# Patient Record
Sex: Female | Born: 1980 | Race: White | Hispanic: No | Marital: Married | Smoking: Never smoker
Health system: Southern US, Community
[De-identification: ages and names within clinical notes are randomized; demographics above are authoritative.]

---

## 2004-02-28 ENCOUNTER — Ambulatory Visit (HOSPITAL_COMMUNITY): Admission: RE | Admit: 2004-02-28 | Discharge: 2004-02-28 | Payer: Self-pay | Admitting: Certified Nurse Midwife

## 2004-05-07 ENCOUNTER — Observation Stay (HOSPITAL_COMMUNITY): Admission: AD | Admit: 2004-05-07 | Discharge: 2004-05-08 | Payer: Self-pay | Admitting: Obstetrics

## 2004-07-04 ENCOUNTER — Inpatient Hospital Stay (HOSPITAL_COMMUNITY): Admission: AD | Admit: 2004-07-04 | Discharge: 2004-07-06 | Payer: Self-pay | Admitting: Obstetrics

## 2005-08-15 IMAGING — US US OB COMP +14 WK
1 series · 13 of 28 positions shown · non-contrast
Comparison: none

CLINICAL DATA: Anatomy scan; no information regarding dating by LMP; uncertain dates.

[Series 1: us ob comp +14 wk · 0.31mm/px · 13 of 76 slices shown]
[im 3/76]
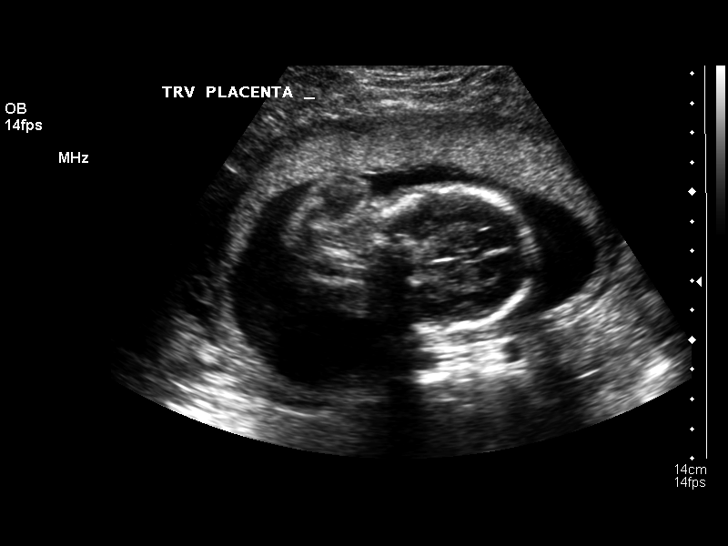
[im 9/76]
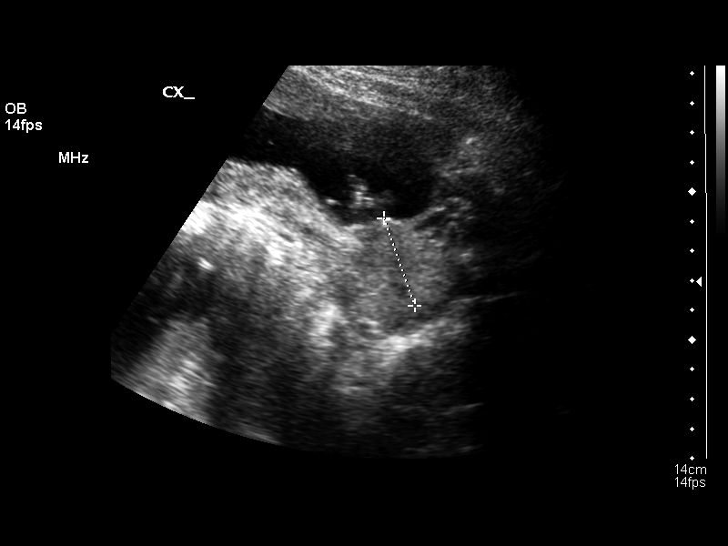
[im 14/76]
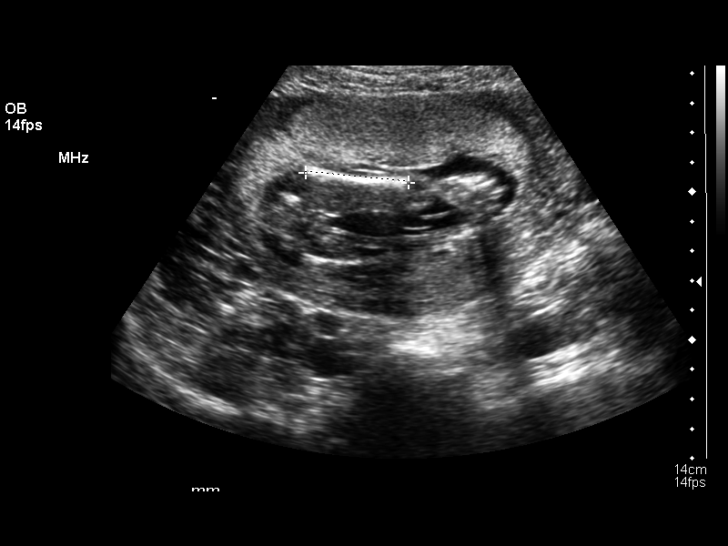
[im 20/76]
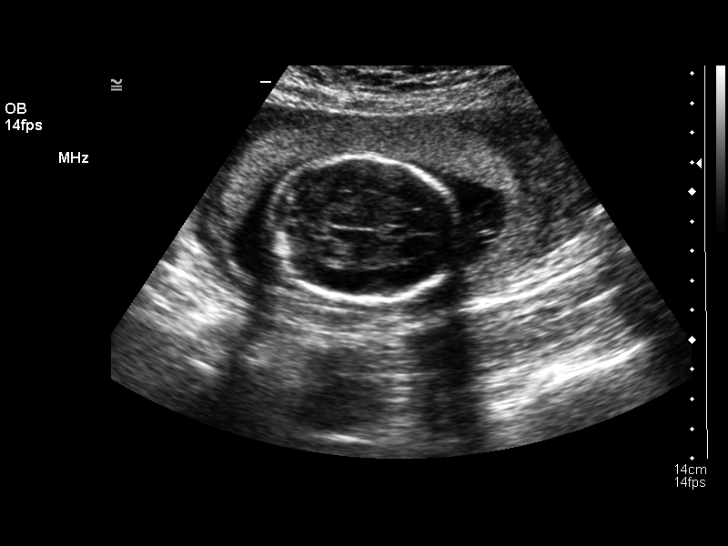
[im 26/76]
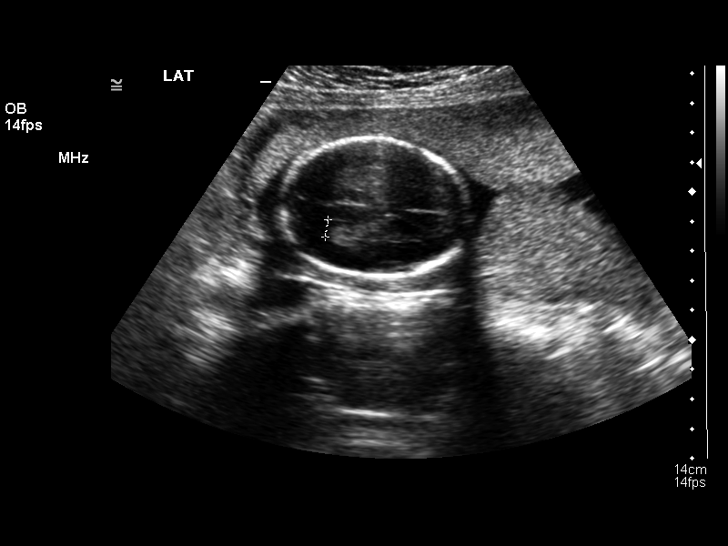
[im 31/76]
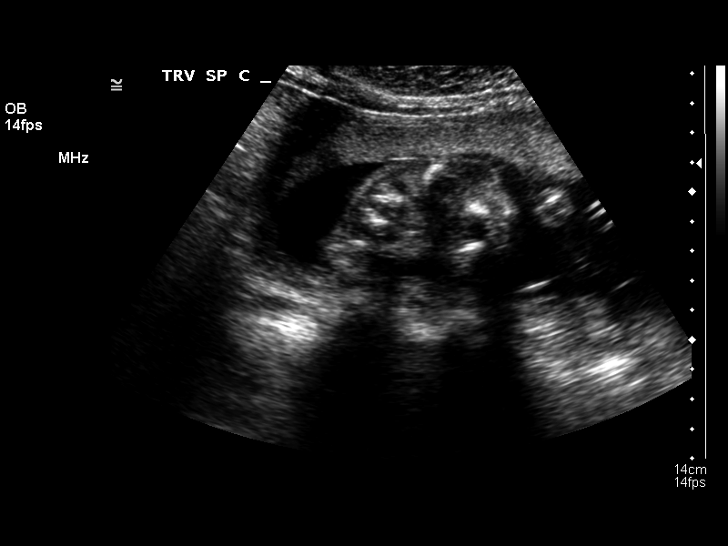
[im 39/76]
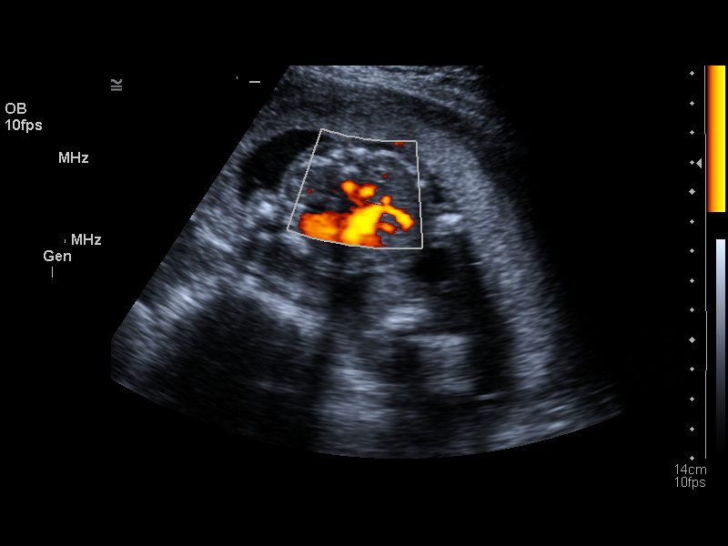
[im 45/76]
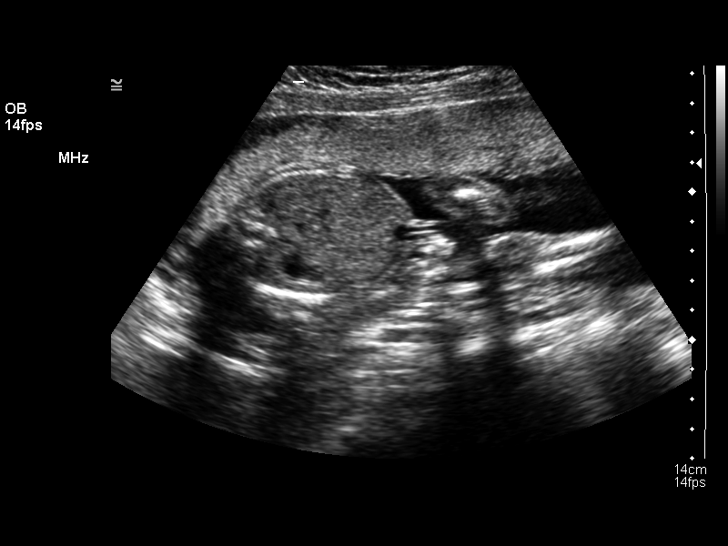
[im 51/76]
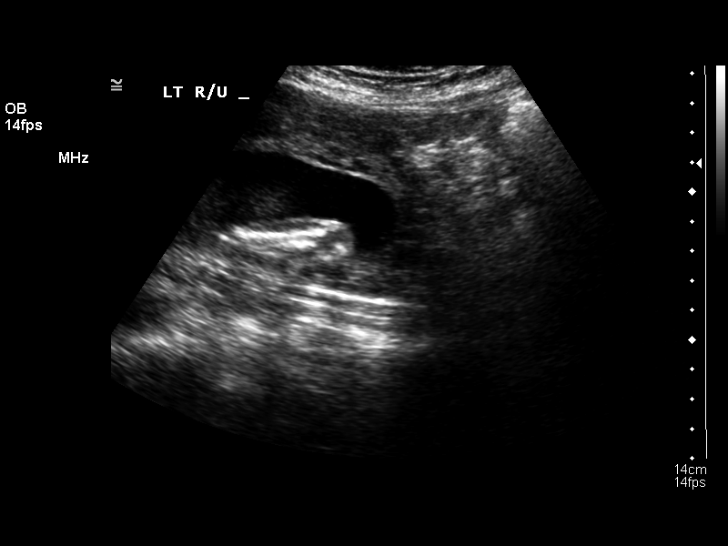
[im 56/76]
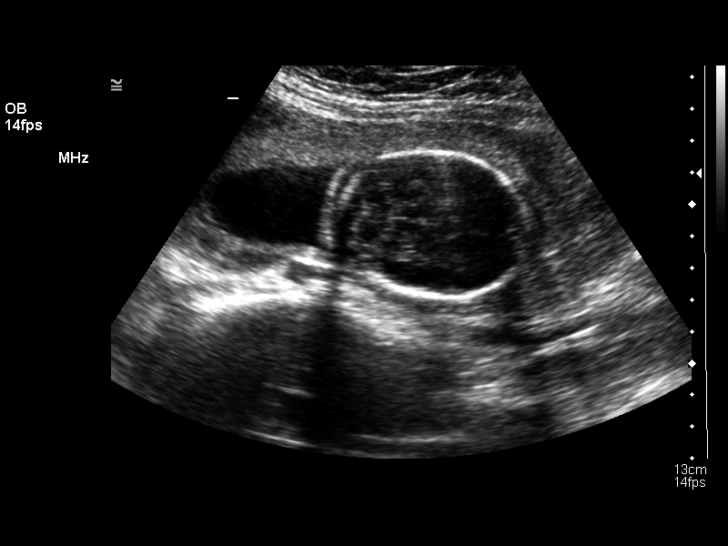
[im 62/76]
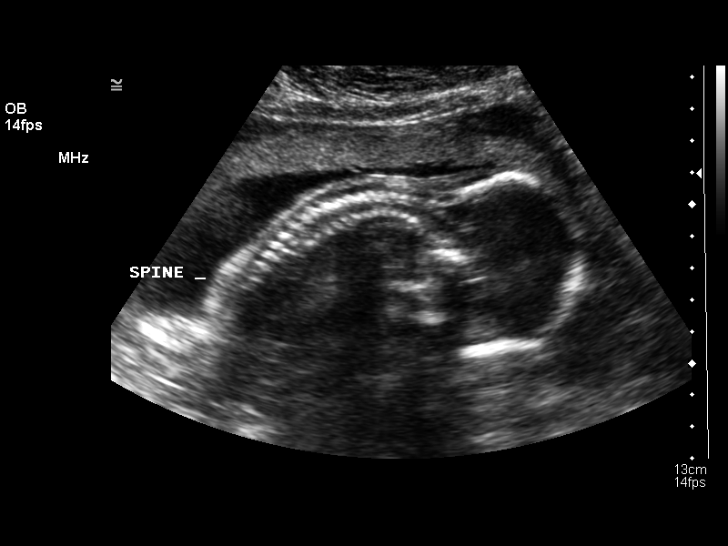
[im 67/76]
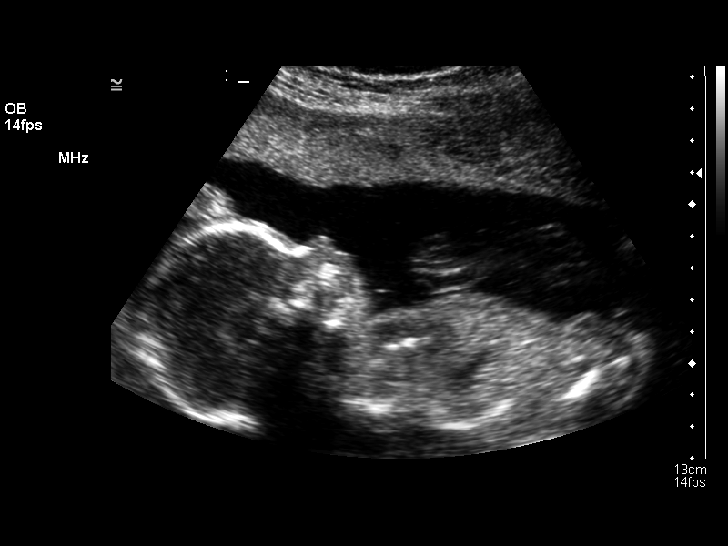
[im 73/76]
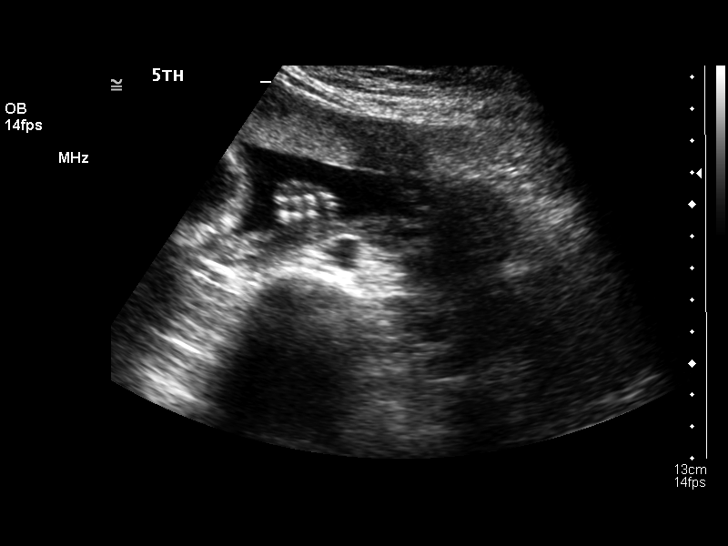

[13 of 28 positions shown; findings below may reference images not displayed]

OBSTETRICAL ULTRASOUND
 Number of Fetuses: 1
 Heart Rate:  157
 Movement:  Yes
 Breathing:    No  
 Presentation:  Breech
 Placental Location:  Anterior
 Grade:  I
 Previa:  No
 Amniotic Fluid (Subjective):  Normal
 Amniotic Fluid (Objective):   3.2 cm Vertical pocket 

 FETAL BIOMETRY
 BPD:   4.7 cm   20 w 2 d
 HC:   18.4 cm   20 w 5 d
 AC:   16.6 cm   21 w 5 d
 FL:    3.5 cm   20 w 6 d

 MEAN GA:  20 w 6 d

 FETAL ANATOMY
 Lateral Ventricles:    Visualized 
 Thalami/CSP:      Visualized 
 Posterior Fossa:  Visualized 
 Nuchal Region:    Visualized 
 Spine:      Visualized 
 4 Chamber Heart on Left:      Visualized 
 Stomach on Left:      Visualized 
 3 Vessel Cord:    Visualized 
 Cord Insertion site:    Visualized 
 Kidneys:  Visualized 
 Bladder:  Visualized 
 Extremities:      Visualized 

 ADDITIONAL ANATOMY VISUALIZED:  LVOT, RVOT, upper lip, orbits, profile, diaphragm, heel, 5th digit, ductal arch, aortic arch, female genitalia and nasal bone.

 MATERNAL FINDINGS
 Cervix:   3.1 cm Transabdominally
IMPRESSION: There is a single living intrauterine gestation in breech presentation.  The mean gestational age by today?s ultrasound is 20 weeks 6 days with an EDC of 07/11/04.  The visualized anatomy is normal and the fetal indices are concordant.  

 </u12:p>

## 2005-10-24 IMAGING — US US OB COMP +14 WK
1 series · 13 of 26 positions shown · non-contrast
Comparison: none

CLINICAL DATA: Vaginal bleeding; assigned gestational age is 30 weeks 6 days.

[Series 1: us ob comp +14 wk · 13 of 26 slices shown]
[im 2/26]
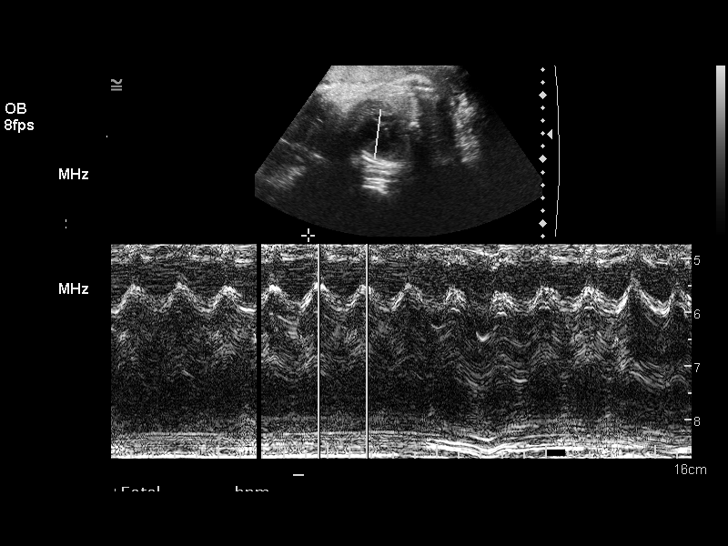
[im 4/26]
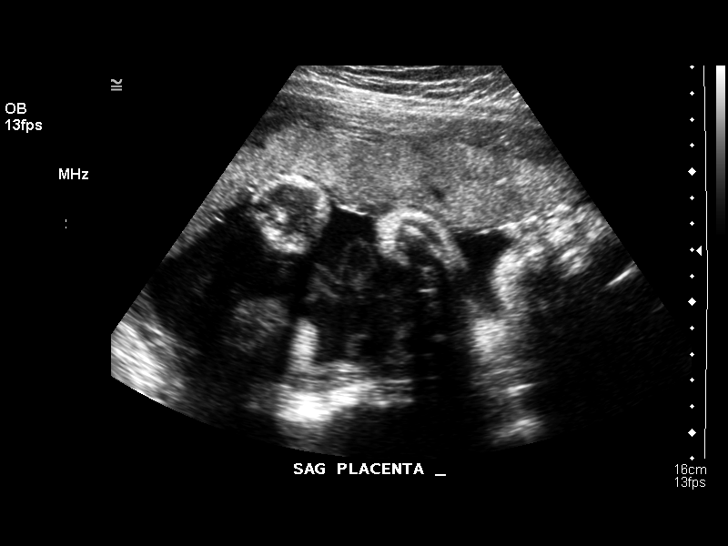
[im 6/26]
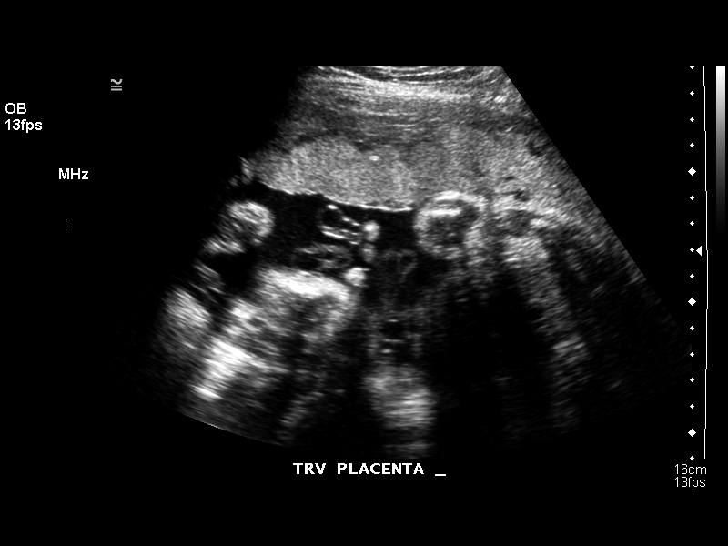
[im 8/26]
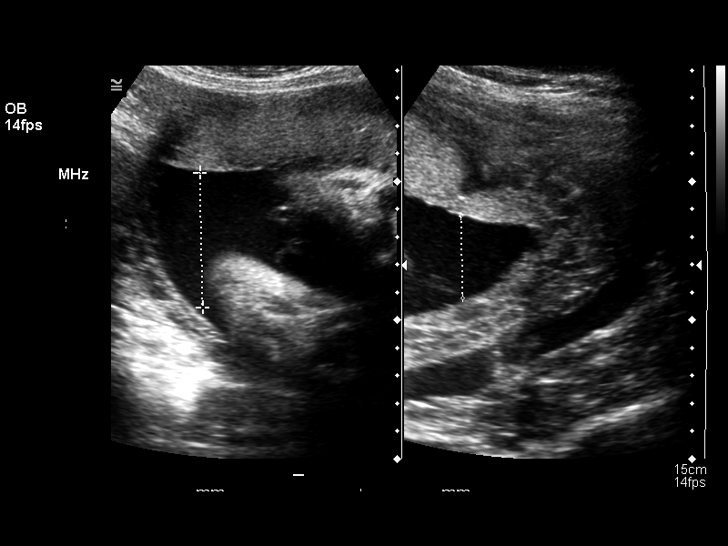
[im 10/26]
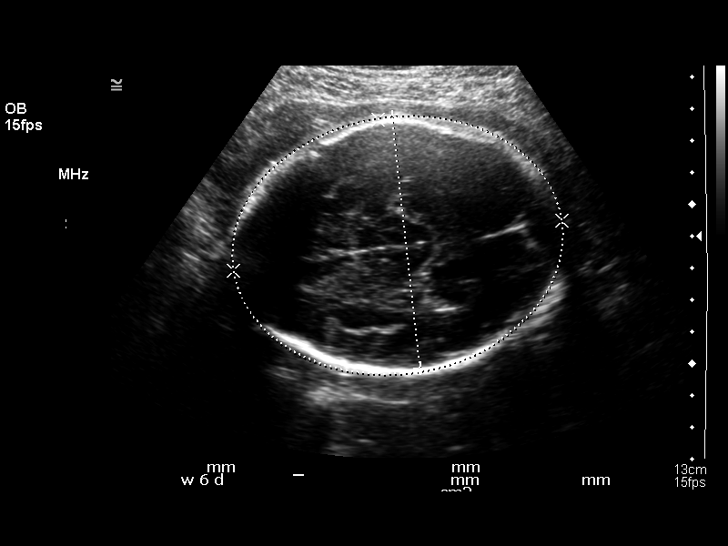
[im 12/26]
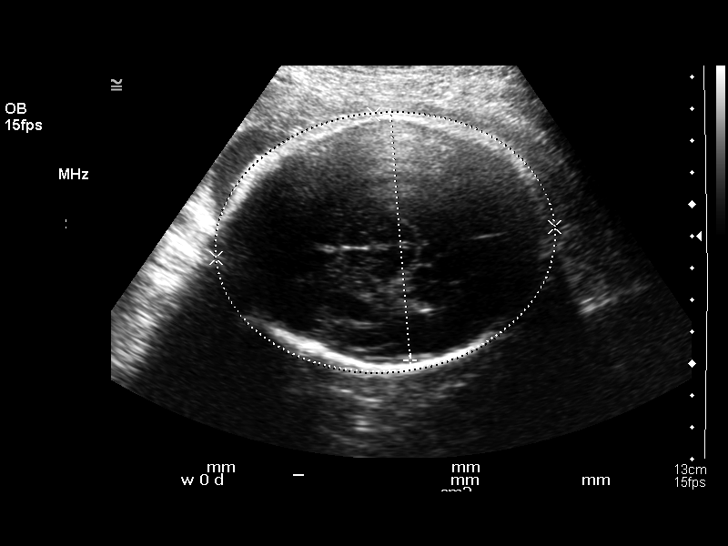
[im 14/26]
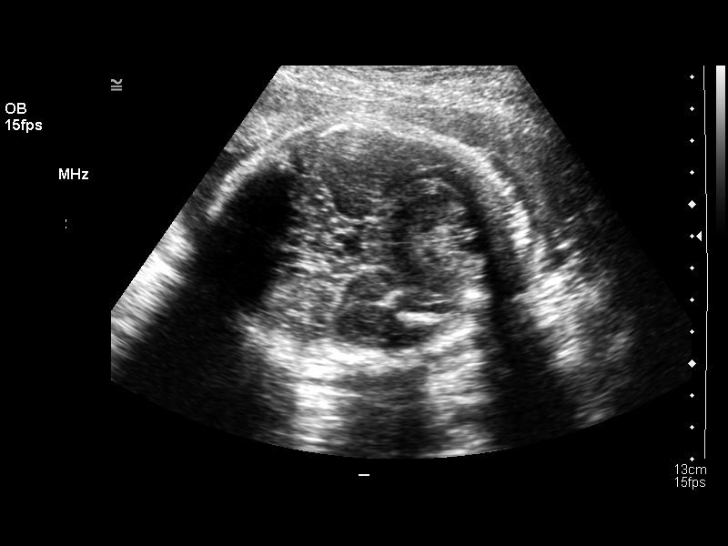
[im 16/26]
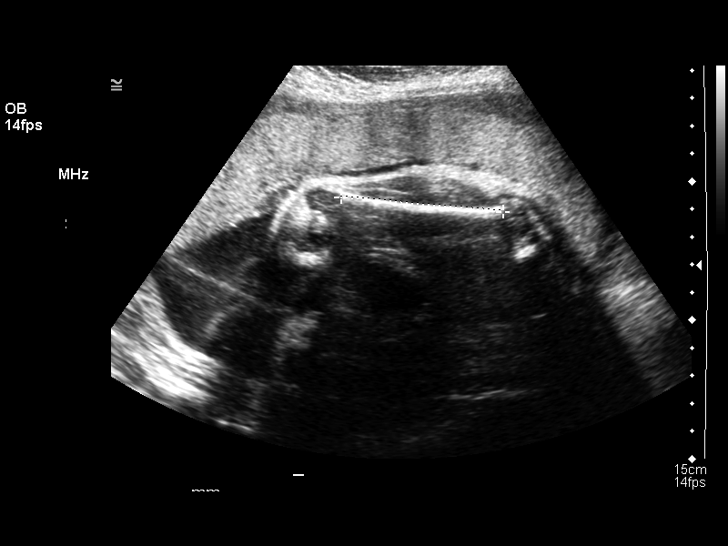
[im 18/26]
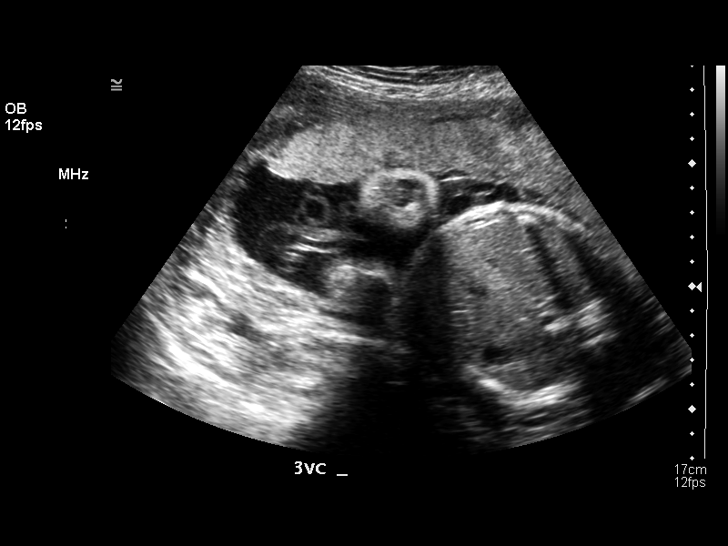
[im 20/26]
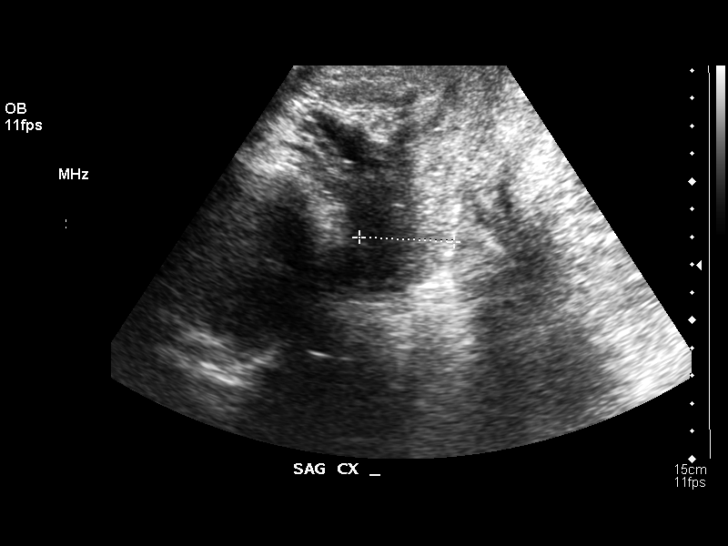
[im 22/26]
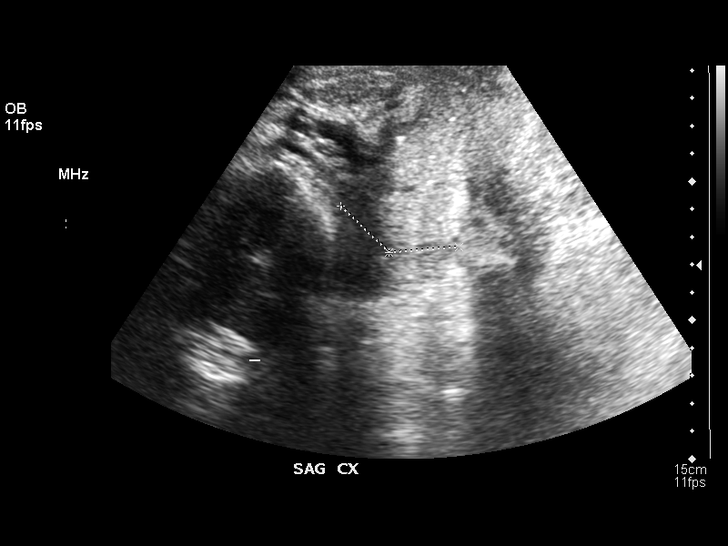
[im 24/26]
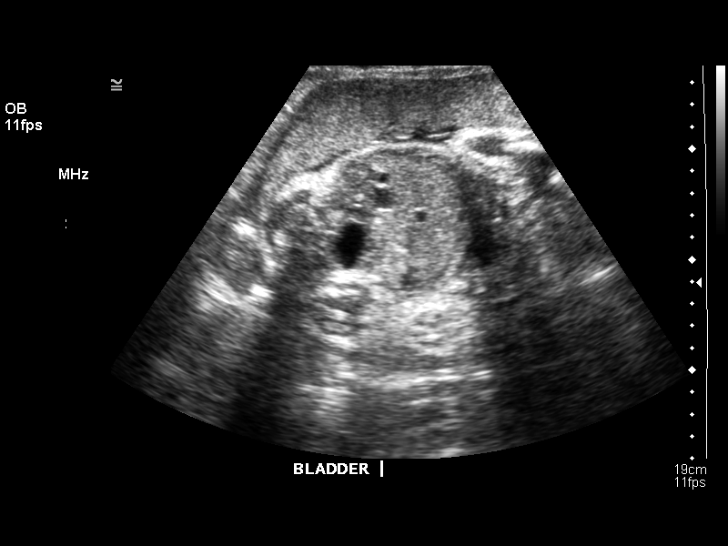
[im 26/26]
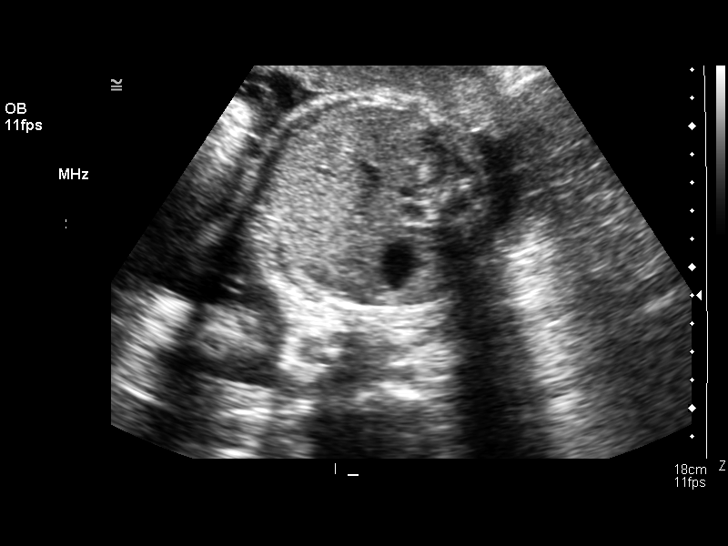

[13 of 26 positions shown; findings below may reference images not displayed]

OBSTETRICAL ULTRASOUND
 Number of Fetuses:  1
 Heart Rate:  136
 Movement:  Yes
 Breathing:  Yes  
 Presentation:  Cephalic
 Placental Location:  Anterior
 Grade:  I
 Previa:  No
 Amniotic Fluid (Subjective):  Normal
 Amniotic Fluid (Objective):   14.7 cm AFI (5th -95th%ile = 8.8 ? 23.8 cm for 31 wks)

 FETAL BIOMETRY
 BPD:   7.9 cm   31 w 4 d
 HC:   29.3 cm   32 w 2 d
 AC:   27.2 cm   31 w 2 d
 FL:    5.8 cm   30 w 2 d

 MEAN GA:  31 w 2 d

 EFW:  3673 g (H) 75th ? 90th%ile (8084 ? 0812 g) For 31 wks

 FETAL ANATOMY
 Lateral Ventricles:    Visualized 
 Thalami/CSP:      Visualized 
 Posterior Fossa:  Visualized 
 Nuchal Region:    Previously seen 
 Spine:      Previously seen 
 4 Chamber Heart on Left:      Previously seen 
 Stomach on Left:      Visualized 
 3 Vessel Cord:    Visualized 
 Cord Insertion site:    Previously seen 
 Kidneys:  Visualized 
 Bladder:  Visualized 
 Extremities:      Previously seen 

 MATERNAL FINDINGS
 Cervix:   3.4 cm Translabially
IMPRESSION: There is a single living intrauterine gestation in cephalic presentation.  The estimated fetal weight is at the 75th ? 90th percentile for a 31 week gestation.  The fetal indices are concordant.  
 The amniotic fluid volume is within normal limits.  
 The cervix is long and closed, measuring 3.4 cm translabially.

 </u12:p>

## 2009-03-30 ENCOUNTER — Ambulatory Visit (HOSPITAL_COMMUNITY): Admission: RE | Admit: 2009-03-30 | Discharge: 2009-03-30 | Payer: Self-pay | Admitting: Obstetrics & Gynecology

## 2009-07-31 ENCOUNTER — Inpatient Hospital Stay (HOSPITAL_COMMUNITY)
Admission: AD | Admit: 2009-07-31 | Discharge: 2009-08-01 | Payer: Self-pay | Source: Ambulatory Visit | Admitting: Obstetrics

## 2010-09-15 IMAGING — US US OB DETAIL+14 WK
1 series · 14 of 28 positions shown · non-contrast
Comparison: none

OBSTETRICAL ULTRASOUND:
 This ultrasound exam was performed in the [HOSPITAL] Ultrasound Department.  The OB US report was generated in the AS system, and faxed to the ordering physician.  This report is also available in [REDACTED] PACS.

[Series 1: us ob detail +14 wk · 46 acquisitions, 14 frames shown]
[im 2/46]
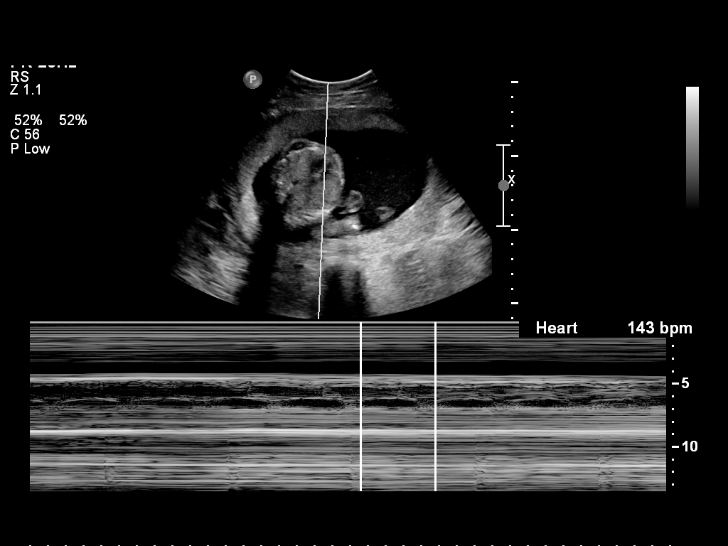
[im 6/46]
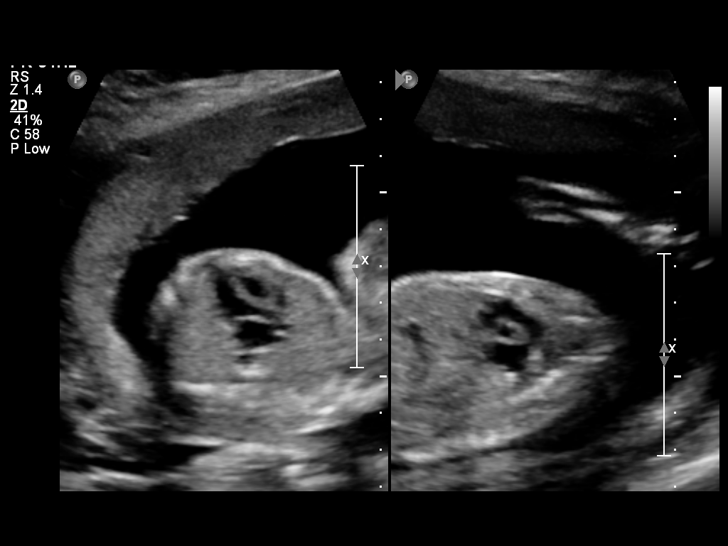
[im 9/46]
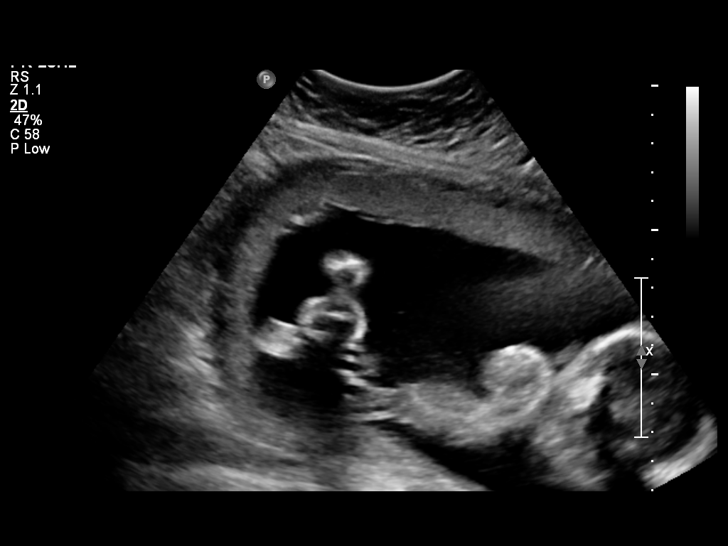
[im 12/46]
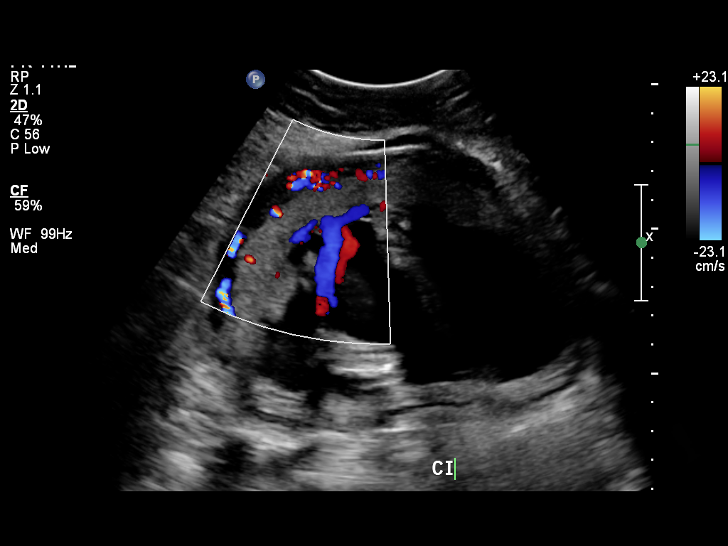
[im 16/46]
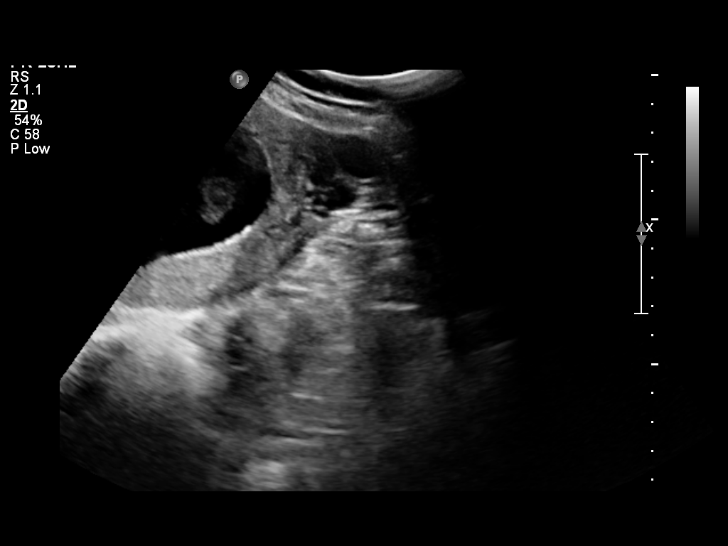
[im 19/46]
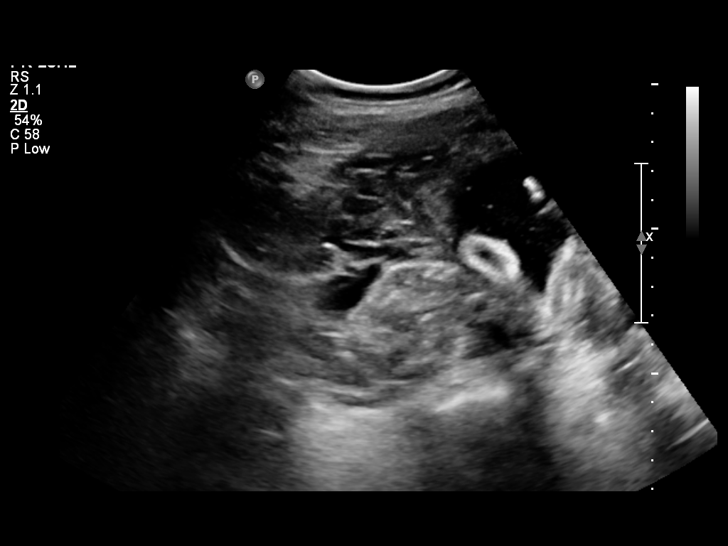
[im 22/46]
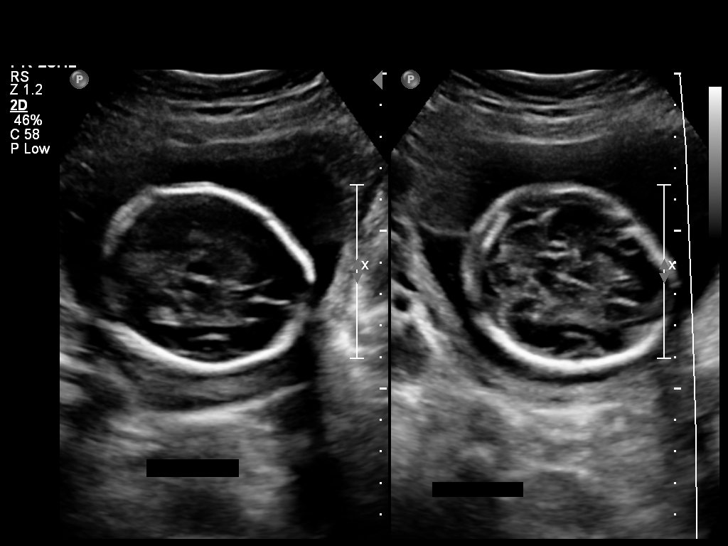
[im 26/46]
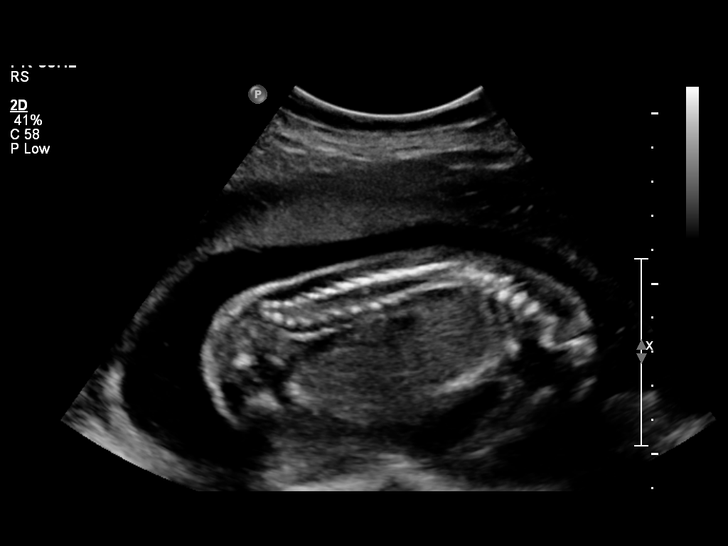
[im 29/46]
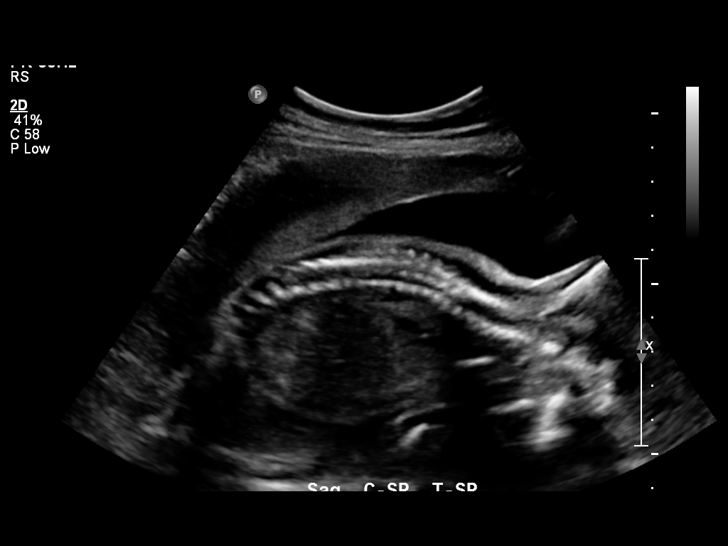
[im 32/46]
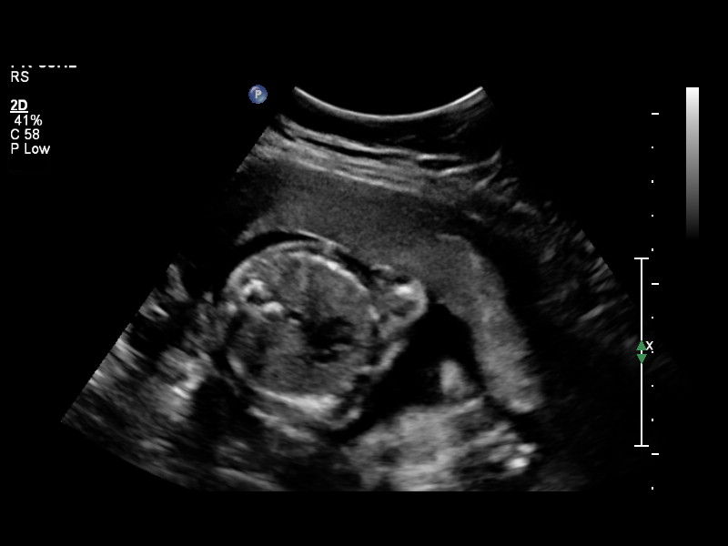
[im 36/46]
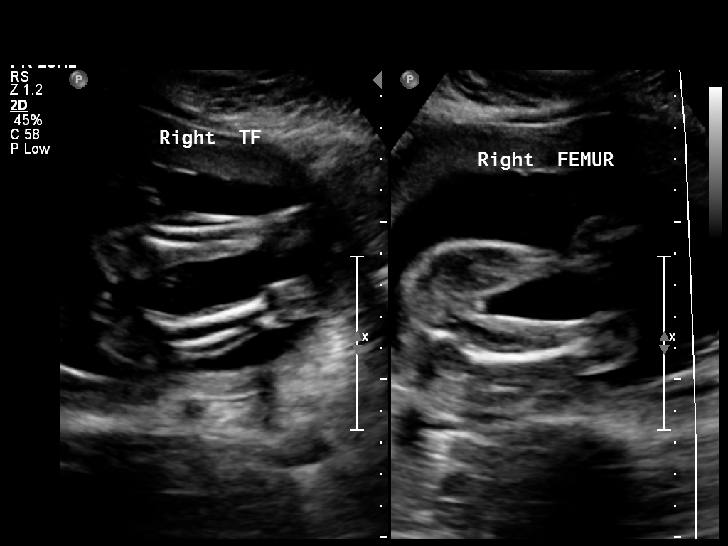
[im 39/46]
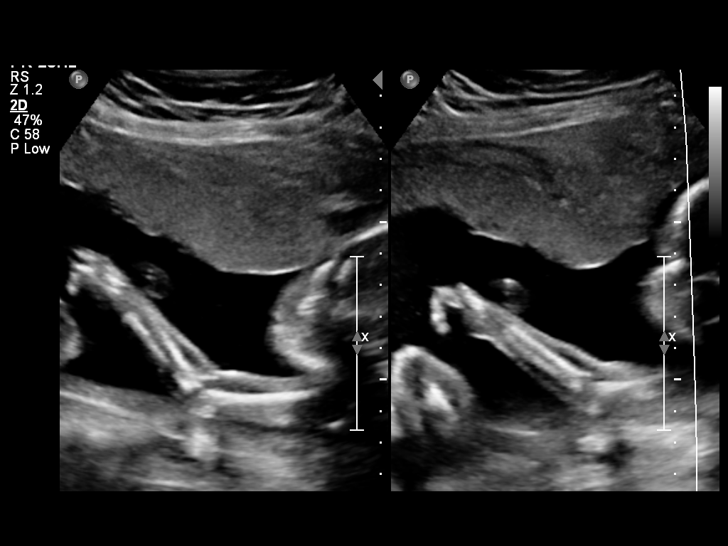
[im 42/46]
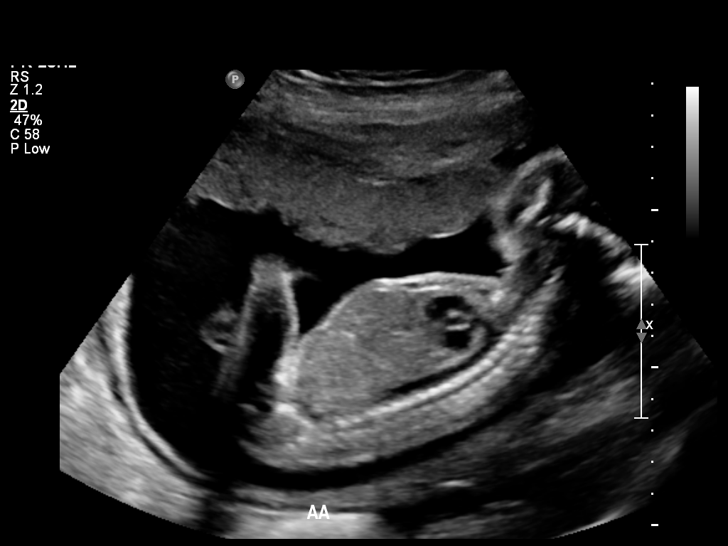
[im 46/46]
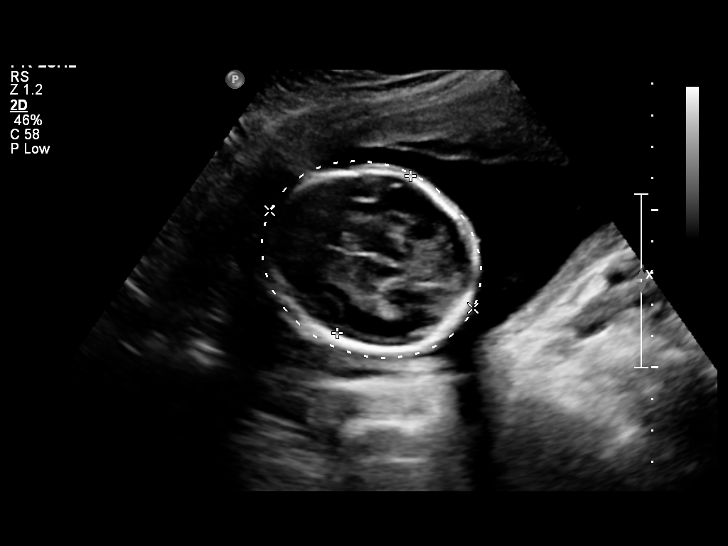

[14 of 28 positions shown; findings below may reference images not displayed]

IMPRESSION: See [facility] Obstetric US report.

## 2010-10-07 LAB — CBC
HCT: 28.3 % — ABNORMAL LOW (ref 36.0–46.0)
HCT: 32.7 % — ABNORMAL LOW (ref 36.0–46.0)
Hemoglobin: 10.7 g/dL — ABNORMAL LOW (ref 12.0–15.0)
Hemoglobin: 9.3 g/dL — ABNORMAL LOW (ref 12.0–15.0)
MCHC: 32.7 g/dL (ref 30.0–36.0)
MCHC: 32.8 g/dL (ref 30.0–36.0)
MCV: 83.4 fL (ref 78.0–100.0)
MCV: 84.4 fL (ref 78.0–100.0)
Platelets: 105 10*3/uL — ABNORMAL LOW (ref 150–400)
Platelets: 120 10*3/uL — ABNORMAL LOW (ref 150–400)
RBC: 3.35 MIL/uL — ABNORMAL LOW (ref 3.87–5.11)
RBC: 3.92 MIL/uL (ref 3.87–5.11)
RDW: 16.7 % — ABNORMAL HIGH (ref 11.5–15.5)
RDW: 17.1 % — ABNORMAL HIGH (ref 11.5–15.5)
WBC: 12 10*3/uL — ABNORMAL HIGH (ref 4.0–10.5)
WBC: 9.9 10*3/uL (ref 4.0–10.5)

## 2010-10-07 LAB — RPR: RPR Ser Ql: NONREACTIVE

## 2010-11-20 ENCOUNTER — Inpatient Hospital Stay (HOSPITAL_COMMUNITY)
Admission: AD | Admit: 2010-11-20 | Discharge: 2010-11-22 | DRG: 372 | Disposition: A | Discharge status: Home / Self Care | Payer: BC Managed Care – PPO | Source: Ambulatory Visit | Attending: Obstetrics | Admitting: Obstetrics

## 2010-11-20 LAB — CBC
HCT: 38.1 % (ref 36.0–46.0)
Hemoglobin: 12.4 g/dL (ref 12.0–15.0)
MCH: 29 pg (ref 26.0–34.0)
MCHC: 32.5 g/dL (ref 30.0–36.0)
MCV: 89.2 fL (ref 78.0–100.0)
Platelets: 138 10*3/uL — ABNORMAL LOW (ref 150–400)
RBC: 4.27 MIL/uL (ref 3.87–5.11)
RDW: 15.5 % (ref 11.5–15.5)
WBC: 10.7 10*3/uL — ABNORMAL HIGH (ref 4.0–10.5)

## 2010-11-20 LAB — RPR: RPR Ser Ql: NONREACTIVE

## 2010-11-22 LAB — CBC
HCT: 33.7 % — ABNORMAL LOW (ref 36.0–46.0)
Hemoglobin: 10.9 g/dL — ABNORMAL LOW (ref 12.0–15.0)
MCH: 29.4 pg (ref 26.0–34.0)
MCHC: 32.3 g/dL (ref 30.0–36.0)
MCV: 90.8 fL (ref 78.0–100.0)
Platelets: 132 10*3/uL — ABNORMAL LOW (ref 150–400)
RBC: 3.71 MIL/uL — ABNORMAL LOW (ref 3.87–5.11)
RDW: 15.9 % — ABNORMAL HIGH (ref 11.5–15.5)
WBC: 9 10*3/uL (ref 4.0–10.5)

## 2015-08-11 ENCOUNTER — Encounter: Payer: Self-pay | Admitting: Obstetrics

## 2015-08-11 ENCOUNTER — Ambulatory Visit (INDEPENDENT_AMBULATORY_CARE_PROVIDER_SITE_OTHER): Payer: BLUE CROSS/BLUE SHIELD | Admitting: Obstetrics

## 2015-08-11 VITALS — BP 109/67 | HR 86 | Temp 98.3°F | Wt 158.0 lb

## 2015-08-11 DIAGNOSIS — N898 Other specified noninflammatory disorders of vagina: Secondary | ICD-10-CM

## 2015-08-11 DIAGNOSIS — T192XXA Foreign body in vulva and vagina, initial encounter: Secondary | ICD-10-CM

## 2015-08-11 NOTE — Progress Notes (Signed)
Patient ID: Chelsea Banks, female   DOB: [birth year], 35 y.o.   MRN: [id number]  Chief Complaint  Patient presents with  . Vaginal Discharge    vaginal discharge with odor    HPI Chelsea Banks is a 35 y.o. female.  Vaginal discharge with odor.  Bad vaginal odor after intercourse.  HPI  History reviewed. No pertinent past medical history.  History reviewed. No pertinent past surgical history.  History reviewed. No pertinent family history.  Social History Social History  Substance Use Topics  . Smoking status: Never Smoker   . Smokeless tobacco: Never Used  . Alcohol Use: 0.0 oz/week    0 Standard drinks or equivalent per week     Comment: Occassionally    No Known Allergies  No current outpatient prescriptions on file.   No current facility-administered medications for this visit.    Review of Systems Review of Systems Constitutional: negative for fatigue and weight loss Respiratory: negative for cough and wheezing Cardiovascular: negative for chest pain, fatigue and palpitations Gastrointestinal: negative for abdominal pain and change in bowel habits Genitourinary: vaginal discharge with odor Integument/breast: negative for nipple discharge Musculoskeletal:negative for myalgias Neurological: negative for gait problems and tremors Behavioral/Psych: negative for abusive relationship, depression Endocrine: negative for temperature intolerance     Blood pressure 109/67, pulse 86, temperature 98.3 F (36.8 C), weight 158 lb (71.668 kg), last menstrual period 07/11/2015.  Physical Exam Physical Exam           General:  Alert and no distress Abdomen:  normal findings: no organomegaly, soft, non-tender and no hernia  Pelvis:  External genitalia: normal general appearance Urinary system: urethral meatus normal and bladder without fullness, nontender Vaginal: malodorous tampon observed in posterior vault, removed.  No lesions or masses observed. Cervix: normal  appearance Adnexa: normal bimanual exam Uterus: anteverted and non-tender, normal size      Data Reviewed Urinalysis Wet prep  Assessment     Malodorous vaginal discharge Tampon left in vagina     Plan    Wet prep sent Tampon removed F/U prn  Orders Placed This Encounter  Procedures  . SureSwab, Vaginosis/Vaginitis Plus   No orders of the defined types were placed in this encounter.

## 2015-08-15 LAB — SURESWAB, VAGINOSIS/VAGINITIS PLUS
Atopobium vaginae: NOT DETECTED Log (cells/mL)
BV CATEGORY: UNDETERMINED — AB
C. albicans, DNA: NOT DETECTED
C. glabrata, DNA: NOT DETECTED
C. parapsilosis, DNA: NOT DETECTED
C. trachomatis RNA, TMA: NOT DETECTED
C. tropicalis, DNA: NOT DETECTED
Gardnerella vaginalis: 6.2 Log (cells/mL)
LACTOBACILLUS SPECIES: 6.4 Log (cells/mL)
MEGASPHAERA SPECIES: NOT DETECTED Log (cells/mL)
N. gonorrhoeae RNA, TMA: NOT DETECTED
T. vaginalis RNA, QL TMA: NOT DETECTED

## 2015-08-16 ENCOUNTER — Other Ambulatory Visit: Payer: Self-pay | Admitting: Obstetrics

## 2015-08-16 DIAGNOSIS — B9689 Other specified bacterial agents as the cause of diseases classified elsewhere: Secondary | ICD-10-CM

## 2015-08-16 DIAGNOSIS — N760 Acute vaginitis: Principal | ICD-10-CM

## 2015-08-16 MED ORDER — METRONIDAZOLE 500 MG PO TABS: 500.0000 mg | ORAL_TABLET | Freq: Two times a day (BID) | ORAL | Status: DC

## 2015-12-21 ENCOUNTER — Ambulatory Visit (INDEPENDENT_AMBULATORY_CARE_PROVIDER_SITE_OTHER): Payer: BLUE CROSS/BLUE SHIELD | Admitting: Obstetrics

## 2015-12-21 ENCOUNTER — Encounter: Payer: Self-pay | Admitting: Obstetrics

## 2015-12-21 VITALS — BP 120/73 | HR 84 | Temp 99.3°F | Wt 150.0 lb

## 2015-12-21 DIAGNOSIS — Z01419 Encounter for gynecological examination (general) (routine) without abnormal findings: Secondary | ICD-10-CM

## 2015-12-23 NOTE — Progress Notes (Signed)
   Subjective:        Chelsea Banks is a 35 y.o. female here for a routine exam.  Current complaints: None.    Personal health questionnaire:  Is patient [name], have a family history of breast and/or ovarian cancer: no Is there a family history of uterine cancer diagnosed at age < 7050, gastrointestinal cancer, urinary tract cancer, family member who is a Personnel officerLynch syndrome-associated carrier: no Is the patient overweight and hypertensive, family history of diabetes, personal history of gestational diabetes, preeclampsia or PCOS: no Is patient over 3255, have PCOS,  family history of premature CHD under age 37, diabetes, smoke, have hypertension or peripheral artery disease:  no At any time, has a partner hit, kicked or otherwise hurt or frightened you?: no Over the past 2 weeks, have you felt down, depressed or hopeless?: no Over the past 2 weeks, have you felt little interest or pleasure in doing things?:no   Gynecologic History No LMP recorded. Contraception: vasectomy Last Pap: not asked. Results were: not asked Last mammogram: n/a. Results were: n/a  Obstetric History OB History  No data available    History reviewed. No pertinent past medical history.  History reviewed. No pertinent past surgical history.  No current outpatient prescriptions on file. No Known Allergies  Social History  Substance Use Topics  . Smoking status: Never Smoker   . Smokeless tobacco: Never Used  . Alcohol Use: 0.0 oz/week    0 Standard drinks or equivalent per week     Comment: Occassionally    History reviewed. No pertinent family history.    Review of Systems  Constitutional: negative for fatigue and weight loss Respiratory: negative for cough and wheezing Cardiovascular: negative for chest pain, fatigue and palpitations Gastrointestinal: negative for abdominal pain and change in bowel habits Musculoskeletal:negative for myalgias Neurological: negative for gait problems and  tremors Behavioral/Psych: negative for abusive relationship, depression Endocrine: negative for temperature intolerance   Genitourinary:negative for abnormal menstrual periods, genital lesions, hot flashes, sexual problems and vaginal discharge Integument/breast: negative for breast lump, breast tenderness, nipple discharge and skin lesion(s)    Objective:       BP 120/73 mmHg  Pulse 84  Temp(Src) 99.3 F (37.4 C)  Wt 150 lb (68.04 kg) General:   alert  Skin:   no rash or abnormalities  Lungs:   clear to auscultation bilaterally  Heart:   regular rate and rhythm, S1, S2 normal, no murmur, click, rub or gallop  Breasts:   normal without suspicious masses, skin or nipple changes or axillary nodes  Abdomen:  normal findings: no organomegaly, soft, non-tender and no hernia  Pelvis:  External genitalia: normal general appearance Urinary system: urethral meatus normal and bladder without fullness, nontender Vaginal: normal without tenderness, induration or masses Cervix: normal appearance Adnexa: normal bimanual exam Uterus: anteverted and non-tender, normal size   Lab Review Urine pregnancy test Labs reviewed yes Radiologic studies reviewed no    Assessment:    Healthy female exam.    Plan:    Education reviewed: calcium supplements, low fat, low cholesterol diet, self breast exams and weight bearing exercise. Contraception: vasectomy. Follow up in: 1 year.   No orders of the defined types were placed in this encounter.   No orders of the defined types were placed in this encounter.

## 2015-12-26 LAB — NUSWAB VG+, CANDIDA 6SP
Candida albicans, NAA: NEGATIVE
Candida glabrata, NAA: NEGATIVE
Candida krusei, NAA: NEGATIVE
Candida lusitaniae, NAA: NEGATIVE
Candida parapsilosis, NAA: NEGATIVE
Candida tropicalis, NAA: NEGATIVE
Chlamydia trachomatis, NAA: NEGATIVE
Neisseria gonorrhoeae, NAA: NEGATIVE
Trich vag by NAA: NEGATIVE

## 2015-12-26 LAB — PAP IG AND HPV HIGH-RISK
HPV, high-risk: NEGATIVE
PAP Smear Comment: 0

## 2024-07-01 ENCOUNTER — Ambulatory Visit: Payer: Self-pay | Admitting: *Deleted

## 2024-07-01 NOTE — Telephone Encounter (Signed)
 FYI Only or Action Required?: FYI only for provider: recommended UC .  Patient was last seen in primary care on no PCP.  Called Nurse Triage reporting Sore Throat. Ear pain, cough, runny nose   Symptoms began a week ago.  Interventions attempted: Rest, hydration, or home remedies.  Symptoms are: gradually worsening.  Triage Disposition: See Physician Within 24 Hours  Patient/caregiver understands and will follow disposition?: Yes    Requesting new patient appt. Earliest appt patient agreeable with 09/08/24 at [facility].         Copied from CRM #[id number]. Topic: Clinical - Red Word Triage >> Jul 01, 2024 10:37 AM Rea ORN wrote: Red Word that prompted transfer to Nurse Triage: Increased mucus, sore throat, hoarse, soreness in both ears  Un-established pt, called for appt to [facility] Reason for Disposition  Earache also present  Answer Assessment - Initial Assessment Questions Recommended UC. No PCP . Scheduled appt for new patient appt 09/08/24 at [facility].       1. ONSET: When did the throat start hurting? (Hours or days ago)      1 week ago  2. SEVERITY: How bad is the sore throat? (Scale 1-10; mild, moderate or severe)     Moderate  3. STREP EXPOSURE: Has there been any exposure to strep within the past week? If Yes, ask: What type of contact occurred?      No  4.  VIRAL SYMPTOMS: Are there any symptoms of a cold, such as a runny nose, cough, hoarse voice or red eyes?      Runny nose, cough productive yellow green mucus. Sore throat. Ear pain 5. FEVER: Do you have a fever? If Yes, ask: What is your temperature, how was it measured, and when did it start?     Na  6. PUS ON THE TONSILS: Is there pus on the tonsils in the back of your throat?     No  7. OTHER SYMPTOMS: Do you have any other symptoms? (e.g., difficulty breathing, headache, rash)     Sore throat , headache on and off, runny nose , cough productive  yellow green mucus, ear pain. No fever no chest pain no breathing issues. No rash  8. PREGNANCY: Is there any chance you are pregnant? When was your last menstrual period?     na  Protocols used: Sore Throat-A-AH

## 2024-09-08 ENCOUNTER — Ambulatory Visit: Payer: Self-pay | Admitting: Family
